# Patient Record
Sex: Female | Born: 1945 | Race: White | Hispanic: No | Marital: Married | Smoking: Never smoker
Health system: Southern US, Community
[De-identification: ages and names within clinical notes are randomized; demographics above are authoritative.]

## PROBLEM LIST (undated history)

## (undated) HISTORY — PX: ABDOMINAL HYSTERECTOMY: SHX81

## (undated) HISTORY — PX: ANKLE SURGERY: SHX546

## (undated) HISTORY — PX: EYE SURGERY: SHX253

---

## 2013-05-14 ENCOUNTER — Encounter (HOSPITAL_COMMUNITY): Payer: Self-pay | Admitting: Emergency Medicine

## 2013-05-14 ENCOUNTER — Emergency Department (HOSPITAL_COMMUNITY)
Admission: EM | Admit: 2013-05-14 | Discharge: 2013-05-14 | Disposition: A | Discharge status: Home / Self Care | Payer: Medicare PPO | Attending: Emergency Medicine | Admitting: Emergency Medicine

## 2013-05-14 DIAGNOSIS — G510 Bell's palsy: Secondary | ICD-10-CM | POA: Insufficient documentation

## 2013-05-14 MED ORDER — VALACYCLOVIR HCL 500 MG PO TABS: 500.0000 mg | ORAL_TABLET | Freq: Two times a day (BID) | ORAL | Status: DC

## 2013-05-14 MED ORDER — PREDNISONE 20 MG PO TABS: ORAL_TABLET | ORAL | Status: DC

## 2013-05-14 NOTE — ED Notes (Signed)
Pt reports last night she took 3 glucosamine pills for her arthritis and noticed a bad taste in her mouth all night afterwards, she thought it was due to the pills she took. Then this am she was brushing her teeth and noticed that there was some numbness inside of her mouth. States "feels like i went to the dentist and got some novocain." she has a L sided facial droop now. She has no other stroke symptoms, grips = bilaterally, MAE, speech is clear, A&Ox4.

## 2013-05-14 NOTE — ED Provider Notes (Signed)
CSN: [id number]     Arrival date & time 05/14/13  0920 History   First MD Initiated Contact with Patient 05/14/13 [phone]     Chief Complaint  Patient presents with  . Facial Droop     (Consider location/radiation/quality/duration/timing/severity/associated sxs/prior Treatment) HPI Comments: Patient presents to the ER for evaluation of left facial droop. Patient reports that she had some slightly strange sensation on the left side of her face yesterday, but did not think much of it. When she woke up this morning, however, she noticed that the left side of her face is numb and does not move with the other side. She has not had any headache. There is no numbness, tingling, weakness in any of her extremities. She did have cold symptoms for a couple of weeks prior to this, but these have resolved. There is no eye pain or vision change. She has had tearing of the left eye.   History reviewed. No pertinent past medical history. Past Surgical History  Procedure Laterality Date  . Abdominal hysterectomy    . Ankle surgery    . Eye surgery     No family history on file. History  Substance Use Topics  . Smoking status: Never Smoker   . Smokeless tobacco: Not on file  . Alcohol Use: No   OB History   Grav Para Term Preterm Abortions TAB SAB Ect Mult Living                 Review of Systems  Neurological: Positive for numbness (left face).  All other systems reviewed and are negative.      Allergies  Review of patient's allergies indicates no known allergies.  Home Medications   Current Outpatient Rx  Name  Route  Sig  Dispense  Refill  . predniSONE (DELTASONE) 20 MG tablet      3 tabs po daily x 5 days, then 2.5 tabs x 1 days, then 2 tabs x 1 days, then 1.5 tab x 1 days, then 1 tab x 1 day, then 0.5 tab x 1 day, then stop.   23 tablet   0   . valACYclovir (VALTREX) 500 MG tablet   Oral   Take 1 tablet (500 mg total) by mouth 2 (two) times daily.   14 tablet   0    BP  165/71  Pulse 75  Temp(Src) 97.8 F (36.6 C) (Oral)  Resp 18  Ht 5\' 7"  (1.702 m)  Wt 176 lb (79.833 kg)  BMI 27.56 kg/m2  SpO2 99% Physical Exam  Constitutional: She is oriented to person, place, and time. She appears well-developed and well-nourished. No distress.  HENT:  Head: Normocephalic and atraumatic.  Right Ear: Hearing normal.  Left Ear: Hearing normal.  Nose: Nose normal.  Mouth/Throat: Oropharynx is clear and moist and mucous membranes are normal.  Eyes: Conjunctivae and EOM are normal. Pupils are equal, round, and reactive to light.  Neck: Normal range of motion. Neck supple.  Cardiovascular: Regular rhythm, S1 normal and S2 normal.  Exam reveals no gallop and no friction rub.   No murmur heard. Pulmonary/Chest: Effort normal and breath sounds normal. No respiratory distress. She exhibits no tenderness.  Abdominal: Soft. Normal appearance and bowel sounds are normal. There is no hepatosplenomegaly. There is no tenderness. There is no rebound, no guarding, no tenderness at McBurney's point and negative Murphy's sign. No hernia.  Musculoskeletal: Normal range of motion.  Neurological: She is alert and oriented to person, place, and time.  She has normal strength. A cranial nerve deficit (left facial - including forehead and eyelid) is present. No sensory deficit. Coordination normal. GCS eye subscore is 4. GCS verbal subscore is 5. GCS motor subscore is 6.  Skin: Skin is warm, dry and intact. No rash noted. No cyanosis.  Psychiatric: She has a normal mood and affect. Her speech is normal and behavior is normal. Thought content normal.    ED Course  Procedures (including critical care time) Labs Review Labs Reviewed - No data to display Imaging Review No results found.  EKG Interpretation   None       MDM   Final diagnoses:  Bell's palsy    Patient this morning with left facial droop. Patient has obvious facial nerve palsy on examination. This is not consistent  with central nervous system involvement, but rather peripheral nerve. Diagnosis of Bell's palsy is made by examination. She has no other neurologic findings. Patient will be treated empirically with prednisone, Valtrex. She was informed that she could have a prolonged recovery period may not recover from this. She will be referred to neurology.    Gilda Crease, MD 05/14/13 [phone]

## 2013-05-14 NOTE — Discharge Instructions (Signed)
Bell's Palsy  Bell's palsy is a condition in which the muscles on one side of the face cannot move (paralysis). This is because the nerves in the face are paralyzed. It is most often thought to be caused by a virus. The virus causes swelling of the nerve that controls movement on one side of the face. The nerve travels through a tight space surrounded by bone. When the nerve swells, it can be compressed by the bone. This results in damage to the protective covering around the nerve. This damage interferes with how the nerve communicates with the muscles of the face. As a result, it can cause weakness or paralysis of the facial muscles.   Injury (trauma), tumor, and surgery may cause Bell's palsy, but most of the time the cause is unknown. It is a relatively common condition. It starts suddenly (abrupt onset) with the paralysis usually ending within 2 days. Bell's palsy is not dangerous. But because the eye does not close properly, you may need care to keep the eye from getting dry. This can include splinting (to keep the eye shut) or moistening with artificial tears. Bell's palsy very seldom occurs on both sides of the face at the same time.  SYMPTOMS    Eyebrow sagging.   Drooping of the eyelid and corner of the mouth.   Inability to close one eye.   Loss of taste on the front of the tongue.   Sensitivity to loud noises.  TREATMENT   The treatment is usually non-surgical. If the patient is seen within the first 24 to 48 hours, a short course of steroids may be prescribed, in an attempt to shorten the length of the condition. Antiviral medicines may also be used with the steroids, but it is unclear if they are helpful.   You will need to protect your eye, if you cannot close it. The cornea (clear covering over your eye) will become dry and can be damaged. Artificial tears can be used to keep your eye moist. Glasses or an eye patch should be worn to protect your eye.  PROGNOSIS   Recovery is variable, ranging  from days to months. Although the problem usually goes away completely (about 80% of cases resolve), predicting the outcome is impossible. Most people improve within 3 weeks of when the symptoms began. Improvement may continue for 3 to 6 months. A small number of people have moderate to severe weakness that is permanent.   HOME CARE INSTRUCTIONS    If your caregiver prescribed medication to reduce swelling in the nerve, use as directed. Do not stop taking the medication unless directed by your caregiver.   Use moisturizing eye drops as needed to prevent drying of your eye, as directed by your caregiver.   Protect your eye, as directed by your caregiver.   Use facial massage and exercises, as directed by your caregiver.   Perform your normal activities, and get your normal rest.  SEEK IMMEDIATE MEDICAL CARE IF:    There is pain, redness or irritation in the eye.   You or your child has an oral temperature above 102 F (38.9 C), not controlled by medicine.  MAKE SURE YOU:    Understand these instructions.   Will watch your condition.   Will get help right away if you are not doing well or get worse.  Document Released: 03/11/2005 Document Revised: 06/03/2011 Document Reviewed: 03/20/2009  Exit[employer] Patient Information 2014 [employer].

## 2013-08-17 ENCOUNTER — Ambulatory Visit: Payer: Self-pay | Admitting: Podiatrist

## 2014-07-25 DIAGNOSIS — E559 Vitamin D deficiency, unspecified: Secondary | ICD-10-CM | POA: Diagnosis not present

## 2014-07-25 DIAGNOSIS — Z1389 Encounter for screening for other disorder: Secondary | ICD-10-CM | POA: Diagnosis not present

## 2014-07-25 DIAGNOSIS — Z01419 Encounter for gynecological examination (general) (routine) without abnormal findings: Secondary | ICD-10-CM | POA: Diagnosis not present

## 2014-07-25 DIAGNOSIS — Z0000 Encounter for general adult medical examination without abnormal findings: Secondary | ICD-10-CM | POA: Diagnosis not present

## 2014-07-25 DIAGNOSIS — D510 Vitamin B12 deficiency anemia due to intrinsic factor deficiency: Secondary | ICD-10-CM | POA: Diagnosis not present

## 2014-07-25 DIAGNOSIS — D509 Iron deficiency anemia, unspecified: Secondary | ICD-10-CM | POA: Diagnosis not present

## 2014-08-01 DIAGNOSIS — D510 Vitamin B12 deficiency anemia due to intrinsic factor deficiency: Secondary | ICD-10-CM | POA: Diagnosis not present

## 2014-08-08 DIAGNOSIS — D510 Vitamin B12 deficiency anemia due to intrinsic factor deficiency: Secondary | ICD-10-CM | POA: Diagnosis not present

## 2014-08-15 DIAGNOSIS — D510 Vitamin B12 deficiency anemia due to intrinsic factor deficiency: Secondary | ICD-10-CM | POA: Diagnosis not present

## 2014-08-26 DIAGNOSIS — D511 Vitamin B12 deficiency anemia due to selective vitamin B12 malabsorption with proteinuria: Secondary | ICD-10-CM | POA: Diagnosis not present

## 2014-09-09 DIAGNOSIS — Z1211 Encounter for screening for malignant neoplasm of colon: Secondary | ICD-10-CM | POA: Diagnosis not present

## 2014-10-03 DIAGNOSIS — D510 Vitamin B12 deficiency anemia due to intrinsic factor deficiency: Secondary | ICD-10-CM | POA: Diagnosis not present

## 2014-10-05 DIAGNOSIS — M810 Age-related osteoporosis without current pathological fracture: Secondary | ICD-10-CM | POA: Diagnosis not present

## 2014-10-05 DIAGNOSIS — K5730 Diverticulosis of large intestine without perforation or abscess without bleeding: Secondary | ICD-10-CM | POA: Diagnosis not present

## 2014-10-05 DIAGNOSIS — Z1211 Encounter for screening for malignant neoplasm of colon: Secondary | ICD-10-CM | POA: Diagnosis not present

## 2014-10-05 DIAGNOSIS — K648 Other hemorrhoids: Secondary | ICD-10-CM | POA: Diagnosis not present

## 2014-10-05 DIAGNOSIS — G510 Bell's palsy: Secondary | ICD-10-CM | POA: Diagnosis not present

## 2014-11-08 DIAGNOSIS — D510 Vitamin B12 deficiency anemia due to intrinsic factor deficiency: Secondary | ICD-10-CM | POA: Diagnosis not present

## 2014-11-08 DIAGNOSIS — E559 Vitamin D deficiency, unspecified: Secondary | ICD-10-CM | POA: Diagnosis not present

## 2014-12-26 DIAGNOSIS — K644 Residual hemorrhoidal skin tags: Secondary | ICD-10-CM | POA: Diagnosis not present

## 2015-02-07 DIAGNOSIS — H2703 Aphakia, bilateral: Secondary | ICD-10-CM | POA: Diagnosis not present

## 2015-02-07 DIAGNOSIS — H16223 Keratoconjunctivitis sicca, not specified as Sjogren's, bilateral: Secondary | ICD-10-CM | POA: Diagnosis not present

## 2015-10-30 DIAGNOSIS — Z524 Kidney donor: Secondary | ICD-10-CM | POA: Insufficient documentation

## 2015-10-30 DIAGNOSIS — Z005 Encounter for examination of potential donor of organ and tissue: Secondary | ICD-10-CM | POA: Insufficient documentation

## 2016-03-25 DIAGNOSIS — Z905 Acquired absence of kidney: Secondary | ICD-10-CM

## 2016-03-25 HISTORY — DX: Acquired absence of kidney: Z90.5

## 2017-12-31 DIAGNOSIS — K56609 Unspecified intestinal obstruction, unspecified as to partial versus complete obstruction: Secondary | ICD-10-CM | POA: Insufficient documentation

## 2018-01-01 DIAGNOSIS — Z905 Acquired absence of kidney: Secondary | ICD-10-CM | POA: Insufficient documentation

## 2019-06-22 DIAGNOSIS — Z90710 Acquired absence of both cervix and uterus: Secondary | ICD-10-CM | POA: Diagnosis not present

## 2019-06-22 DIAGNOSIS — K56609 Unspecified intestinal obstruction, unspecified as to partial versus complete obstruction: Secondary | ICD-10-CM | POA: Diagnosis not present

## 2019-06-22 DIAGNOSIS — R933 Abnormal findings on diagnostic imaging of other parts of digestive tract: Secondary | ICD-10-CM | POA: Diagnosis not present

## 2019-06-22 DIAGNOSIS — Z905 Acquired absence of kidney: Secondary | ICD-10-CM | POA: Diagnosis not present

## 2019-06-22 DIAGNOSIS — R112 Nausea with vomiting, unspecified: Secondary | ICD-10-CM | POA: Diagnosis not present

## 2019-06-22 DIAGNOSIS — R1031 Right lower quadrant pain: Secondary | ICD-10-CM | POA: Diagnosis not present

## 2019-06-22 DIAGNOSIS — R1084 Generalized abdominal pain: Secondary | ICD-10-CM | POA: Diagnosis not present

## 2019-06-22 DIAGNOSIS — Z8249 Family history of ischemic heart disease and other diseases of the circulatory system: Secondary | ICD-10-CM | POA: Diagnosis not present

## 2019-06-22 DIAGNOSIS — M810 Age-related osteoporosis without current pathological fracture: Secondary | ICD-10-CM | POA: Diagnosis not present

## 2019-06-22 DIAGNOSIS — Z8049 Family history of malignant neoplasm of other genital organs: Secondary | ICD-10-CM | POA: Diagnosis not present

## 2019-06-22 DIAGNOSIS — D649 Anemia, unspecified: Secondary | ICD-10-CM | POA: Diagnosis not present

## 2019-07-12 DIAGNOSIS — Z8719 Personal history of other diseases of the digestive system: Secondary | ICD-10-CM | POA: Diagnosis not present

## 2019-07-12 DIAGNOSIS — Z6823 Body mass index (BMI) 23.0-23.9, adult: Secondary | ICD-10-CM | POA: Diagnosis not present

## 2019-07-12 DIAGNOSIS — F419 Anxiety disorder, unspecified: Secondary | ICD-10-CM | POA: Diagnosis not present

## 2019-07-12 DIAGNOSIS — K209 Esophagitis, unspecified without bleeding: Secondary | ICD-10-CM | POA: Diagnosis not present

## 2019-07-12 DIAGNOSIS — Z524 Kidney donor: Secondary | ICD-10-CM | POA: Diagnosis not present

## 2019-08-05 DIAGNOSIS — R110 Nausea: Secondary | ICD-10-CM | POA: Diagnosis not present

## 2019-08-05 DIAGNOSIS — R1084 Generalized abdominal pain: Secondary | ICD-10-CM | POA: Diagnosis not present

## 2019-08-05 DIAGNOSIS — R140 Abdominal distension (gaseous): Secondary | ICD-10-CM | POA: Diagnosis not present

## 2019-08-05 DIAGNOSIS — R933 Abnormal findings on diagnostic imaging of other parts of digestive tract: Secondary | ICD-10-CM | POA: Diagnosis not present

## 2019-08-05 DIAGNOSIS — K56600 Partial intestinal obstruction, unspecified as to cause: Secondary | ICD-10-CM | POA: Diagnosis not present

## 2019-09-03 DIAGNOSIS — K2930 Chronic superficial gastritis without bleeding: Secondary | ICD-10-CM | POA: Diagnosis not present

## 2019-09-03 DIAGNOSIS — K3189 Other diseases of stomach and duodenum: Secondary | ICD-10-CM | POA: Diagnosis not present

## 2019-09-03 DIAGNOSIS — R110 Nausea: Secondary | ICD-10-CM | POA: Diagnosis not present

## 2019-09-03 DIAGNOSIS — R112 Nausea with vomiting, unspecified: Secondary | ICD-10-CM | POA: Diagnosis not present

## 2019-10-05 DIAGNOSIS — Z1331 Encounter for screening for depression: Secondary | ICD-10-CM | POA: Diagnosis not present

## 2019-10-05 DIAGNOSIS — Z0000 Encounter for general adult medical examination without abnormal findings: Secondary | ICD-10-CM | POA: Diagnosis not present

## 2019-10-05 DIAGNOSIS — Z6822 Body mass index (BMI) 22.0-22.9, adult: Secondary | ICD-10-CM | POA: Diagnosis not present

## 2019-10-05 DIAGNOSIS — Z79899 Other long term (current) drug therapy: Secondary | ICD-10-CM | POA: Diagnosis not present

## 2019-10-09 DIAGNOSIS — J019 Acute sinusitis, unspecified: Secondary | ICD-10-CM | POA: Diagnosis not present

## 2019-10-18 DIAGNOSIS — F419 Anxiety disorder, unspecified: Secondary | ICD-10-CM | POA: Diagnosis not present

## 2019-10-18 DIAGNOSIS — J329 Chronic sinusitis, unspecified: Secondary | ICD-10-CM | POA: Diagnosis not present

## 2019-10-18 DIAGNOSIS — J40 Bronchitis, not specified as acute or chronic: Secondary | ICD-10-CM | POA: Diagnosis not present

## 2019-10-22 DIAGNOSIS — J40 Bronchitis, not specified as acute or chronic: Secondary | ICD-10-CM | POA: Diagnosis not present

## 2019-10-22 DIAGNOSIS — J329 Chronic sinusitis, unspecified: Secondary | ICD-10-CM | POA: Diagnosis not present

## 2020-10-30 DIAGNOSIS — Z1331 Encounter for screening for depression: Secondary | ICD-10-CM | POA: Diagnosis not present

## 2020-10-30 DIAGNOSIS — F419 Anxiety disorder, unspecified: Secondary | ICD-10-CM | POA: Diagnosis not present

## 2020-10-30 DIAGNOSIS — Z6823 Body mass index (BMI) 23.0-23.9, adult: Secondary | ICD-10-CM | POA: Diagnosis not present

## 2020-10-30 DIAGNOSIS — M1990 Unspecified osteoarthritis, unspecified site: Secondary | ICD-10-CM | POA: Diagnosis not present

## 2020-10-30 DIAGNOSIS — Z9119 Patient's noncompliance with other medical treatment and regimen: Secondary | ICD-10-CM | POA: Diagnosis not present

## 2020-10-30 DIAGNOSIS — Z0000 Encounter for general adult medical examination without abnormal findings: Secondary | ICD-10-CM | POA: Diagnosis not present

## 2021-01-04 DIAGNOSIS — I10 Essential (primary) hypertension: Secondary | ICD-10-CM | POA: Diagnosis not present

## 2021-01-04 DIAGNOSIS — Z01419 Encounter for gynecological examination (general) (routine) without abnormal findings: Secondary | ICD-10-CM | POA: Diagnosis not present

## 2021-01-04 DIAGNOSIS — Z6824 Body mass index (BMI) 24.0-24.9, adult: Secondary | ICD-10-CM | POA: Diagnosis not present

## 2021-03-05 DIAGNOSIS — B9689 Other specified bacterial agents as the cause of diseases classified elsewhere: Secondary | ICD-10-CM | POA: Diagnosis not present

## 2021-03-05 DIAGNOSIS — J019 Acute sinusitis, unspecified: Secondary | ICD-10-CM | POA: Diagnosis not present

## 2021-04-11 DIAGNOSIS — H2703 Aphakia, bilateral: Secondary | ICD-10-CM | POA: Diagnosis not present

## 2021-06-06 DIAGNOSIS — R109 Unspecified abdominal pain: Secondary | ICD-10-CM | POA: Diagnosis not present

## 2021-06-06 DIAGNOSIS — Z8719 Personal history of other diseases of the digestive system: Secondary | ICD-10-CM | POA: Diagnosis not present

## 2021-06-06 DIAGNOSIS — R4182 Altered mental status, unspecified: Secondary | ICD-10-CM | POA: Diagnosis not present

## 2021-06-06 DIAGNOSIS — Z6823 Body mass index (BMI) 23.0-23.9, adult: Secondary | ICD-10-CM | POA: Diagnosis not present

## 2021-06-06 DIAGNOSIS — Z13220 Encounter for screening for lipoid disorders: Secondary | ICD-10-CM | POA: Diagnosis not present

## 2021-07-02 ENCOUNTER — Encounter: Payer: Self-pay | Admitting: Neurology

## 2021-07-02 ENCOUNTER — Ambulatory Visit: Payer: Medicare PPO | Admitting: Neurology

## 2021-07-02 VITALS — BP 132/72 | HR 69 | Ht 66.5 in | Wt 155.5 lb

## 2021-07-02 DIAGNOSIS — G459 Transient cerebral ischemic attack, unspecified: Secondary | ICD-10-CM | POA: Diagnosis not present

## 2021-07-02 NOTE — Patient Instructions (Addendum)
Obtain stroke labs with BMP  ?Will obtain CT head and neck with contrast (if normal kidney function)  ?Continue with aspirin daily  ?Follow up with your primary care physician ?Follow up in 1 year or sooner if worse  ?

## 2021-07-02 NOTE — Progress Notes (Signed)
? ?[facility] ? ?PATIENT: Janet ClineJudy O Guereca ?DOB: January 04, 1946 ? ?REQUESTING CLINICIAN: Noni Saupeedding, John F. II, MD ?HISTORY FROM: Patient and daughter Amy  ?REASON FOR VISIT: Vision loss and work finding difficulty  ? ? ?HISTORICAL ? ?CHIEF COMPLAINT:  ?Chief Complaint  ?Patient presents with  ? New Patient (Initial Visit)  ?  Rm 15. Accompanied by daughter, Amy. ?NP/Paper Proficient/[facility]/John Redding MD/cognitive changes, possible TIA.  ? ? ?HISTORY OF PRESENT ILLNESS:  ?This is a 76 year old woman with past medical history of Bell's palsy who is presenting with 5-minute history of loss of vision on the left visual field and word finding difficulty.  Patient report being at home, sitting with crying, had sudden loss of her left visual field lasted about 5 minutes. During this time she could not speak, she could not say the name of the family members and this lasted about 25 minutes.  After which her vision improved and her speech also improved. She was not taken to the ED. This happened on March 14,  She denies any previous similar history and since then has not had any reoccurrence. She saw her PCP the following day and was started on daily aspirin.  ?Denies any previous history of stroke or strokelike symptoms. 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 ? ?OTHER MEDICAL CONDITIONS: History of Bells's Palsy  ? ? ?REVIEW OF SYSTEMS: Full 14 system review of systems performed and negative with exception of: as noted in the HPI  ? ?ALLERGIES: ?Allergies  ?Allergen Reactions  ? Oxycodone-Acetaminophen Nausea And Vomiting  ? ? ?HOME MEDICATIONS: ?Outpatient Medications Prior to Visit  ?Medication Sig Dispense Refill  ? ASPIRIN 81 PO Take 1 tablet by mouth daily.    ? escitalopram (LEXAPRO) 5 MG tablet Take 1 tablet by mouth daily.    ? polycarbophil (FIBERCON) 625 MG tablet Take 625 mg by mouth daily.     ? predniSONE (DELTASONE) 20 MG tablet 3 tabs po daily x 5 days, then 2.5 tabs x 1 days, then 2 tabs x 1 days, then 1.5 tab x 1 days, then 1 tab x 1 day, then 0.5 tab x 1 day, then stop. 23 tablet 0  ? valACYclovir (VALTREX) 500 MG tablet Take 1 tablet (500 mg total) by mouth 2 (two) times daily. 14 tablet 0  ? ?No facility-administered medications prior to visit.  ? ? ?PAST MEDICAL HISTORY: ?Past Medical History:  ?Diagnosis Date  ? H/O left nephrectomy 2018  ? Kidney donation  ? ? ?PAST SURGICAL HISTORY: ?Past Surgical History:  ?Procedure Laterality Date  ? ABDOMINAL HYSTERECTOMY    ? ANKLE SURGERY    ? EYE SURGERY    ? ? ?FAMILY HISTORY: ?History reviewed. No pertinent family history. ? ?SOCIAL HISTORY: ?Social History  ? ?Socioeconomic History  ? Marital status: Married  ?  Spouse name: Not on file  ? Number of children: Not on file  ? Years of education: Not on file  ? Highest education level: Not on file  ?Occupational History  ? Not on file  ?Tobacco Use  ? Smoking status: Never  ? Smokeless tobacco: Not on file  ?Substance and Sexual Activity  ? Alcohol use: No  ? Drug use: Not on file  ? Sexual activity: Not on file  ?Other Topics Concern  ? Not on file  ?Social History Narrative  ? Not on file  ? ?  Social Determinants of Health  ? ?Financial Resource Strain: Not on file  ?Food Insecurity: Not on file  ?Transportation Needs: Not on file  ?Physical Activity: Not on file  ?Stress: Not on file  ?Social Connections: Not on file  ?Intimate Partner Violence: Not on file  ? ? ?PHYSICAL EXAM ? ?GENERAL EXAM/CONSTITUTIONAL: ?Vitals:  ?Vitals:  ? 07/02/21 1045  ?BP: 132/72  ?Pulse: 69  ?Weight: 155 lb 8 oz (70.5 kg)  ?Height: 5' 6.5" (1.689 m)  ? ?Body mass index is 24.72 kg/m?. ?Wt Readings from Last 3 Encounters:  ?07/02/21 155 lb 8 oz (70.5 kg)  ?05/14/13 176 lb (79.8 kg)  ? ?Patient is in no distress; well developed, nourished and groomed; neck is supple ? ?EYES: ?Pupils round and reactive to light, Visual fields  full to confrontation, Extraocular movements intacts,  ? ?MUSCULOSKELETAL: ?Gait, strength, tone, movements noted in Neurologic exam below ? ?NEUROLOGIC: ?MENTAL STATUS:  ?   ? View : No data to display.  ?  ?  ?  ? ?awake, alert, oriented to person, place and time ?recent and remote memory intact ?normal attention and concentration ?language fluent, comprehension intact, naming intact ?fund of knowledge appropriate ? ?  07/02/2021  ? 10:50 AM  ?Montreal Cognitive Assessment   ?Visuospatial/ Executive (0/5) 5  ?Naming (0/3) 2  ?Attention: Read list of digits (0/2) 1  ?Attention: Read list of letters (0/1) 1  ?Attention: Serial 7 subtraction starting at 100 (0/3) 3  ?Language: Repeat phrase (0/2) 2  ?Language : Fluency (0/1) 0  ?Abstraction (0/2) 2  ?Delayed Recall (0/5) 4  ?Orientation (0/6) 6  ?Total 26  ?Adjusted Score (based on education) 26  ? ? ? ? ?CRANIAL NERVE:  ?2nd, 3rd, 4th, 6th - pupils equal and reactive to light, visual fields full to confrontation, extraocular muscles intact, no nystagmus ?5th - facial sensation symmetric ?7th - mild left facial weakness ?8th - hearing decrease ?9th - palate elevates symmetrically, uvula midline ?11th - shoulder shrug symmetric ?12th - tongue protrusion midline ? ?MOTOR:  ?normal bulk and tone, full strength in the BUE, BLE ? ?SENSORY:  ?normal and symmetric to light touch, pinprick, temperature, vibration ? ?COORDINATION:  ?finger-nose-finger, fine finger movements normal ? ?REFLEXES:  ?deep tendon reflexes present and symmetric ? ?GAIT/STATION:  ?normal ? ? ?DIAGNOSTIC DATA (LABS, IMAGING, TESTING) ?- I reviewed patient records, labs, notes, testing and imaging myself where available. ? ?No results found for: WBC, HGB, HCT, MCV, PLT ?No results found for: NA, K, CL, CO2, GLUCOSE, BUN, CREATININE, CALCIUM, PROT, ALBUMIN, AST, ALT, ALKPHOS, BILITOT, GFRNONAA, GFRAA ?No results found for: CHOL, HDL, LDLCALC, LDLDIRECT, TRIG, CHOLHDL ?No results found for: HGBA1C ?No  results found for: VITAMINB12 ?No results found for: TSH ? ? ? ?ASSESSMENT AND PLAN ? ?76 y.o. year old female with history of Bell's palsy who is presenting after 1 episode of sudden loss of left vision field associated with word finding difficulty, cannot state the name of grandkids lasting for 25-minutes.  Patient episode is concerning for a TIA.  I will obtain a CT head preferably with contrast if normal kidney function.  I will also obtain stroke lab including hemoglobin A1c and lipids.  If her BMP come back within normal limits, we will obtain a CT head with and without contrast and CTA head and neck with contrast. ?I will contact the patient to go over the result.  Advised her to continue with aspirin 81 mg daily and I will see her  in 1 year for follow-up ? ? ? ?1. TIA (transient ischemic attack)   ? ? ? ?Patient Instructions  ?Obtain stroke labs with BMP  ?Will obtain CT head and neck with contrast (if normal kidney function)  ?Continue with aspirin daily  ?Follow up with your primary care physician ?Follow up in 1 year or sooner if worse  ? ?Orders Placed This Encounter  ?Procedures  ? Basic Metabolic Panel  ? Hemoglobin A1c  ? Lipid panel  ? ? ?No orders of the defined types were placed in this encounter. ? ? ?Return in about 1 year (around 07/03/2022). ? ?I have spent a total of 47 minutes dedicated to this patient today, preparing to see patient, performing a medically appropriate examination and evaluation, ordering tests and/or medications and procedures, and counseling and educating the patient/family/caregiver; independently interpreting result and communicating results to the family/patient/caregiver; and documenting clinical information in the electronic medical record. ? ? ?Windell Norfolk, MD 07/02/2021, 11:27 AM ? ?[facility] ?[address], [address] ?[place], [place] [address] ?[phone] ? ?

## 2021-07-03 LAB — HEMOGLOBIN A1C
Est. average glucose Bld gHb Est-mCnc: 108 mg/dL
Hgb A1c MFr Bld: 5.4 % (ref 4.8–5.6)

## 2021-07-03 LAB — BASIC METABOLIC PANEL
BUN/Creatinine Ratio: 12 (ref 12–28)
BUN: 15 mg/dL (ref 8–27)
CO2: 26 mmol/L (ref 20–29)
Calcium: 9.3 mg/dL (ref 8.7–10.3)
Chloride: 110 mmol/L — ABNORMAL HIGH (ref 96–106)
Creatinine, Ser: 1.28 mg/dL — ABNORMAL HIGH (ref 0.57–1.00)
Glucose: 94 mg/dL (ref 70–99)
Potassium: 5.3 mmol/L — ABNORMAL HIGH (ref 3.5–5.2)
Sodium: 147 mmol/L — ABNORMAL HIGH (ref 134–144)
eGFR: 44 mL/min/{1.73_m2} — ABNORMAL LOW (ref >59)

## 2021-07-03 LAB — LIPID PANEL
Chol/HDL Ratio: 3.8 ratio (ref 0.0–4.4)
Cholesterol, Total: 215 mg/dL — ABNORMAL HIGH (ref 100–199)
HDL: 57 mg/dL (ref >39)
LDL Chol Calc (NIH): 135 mg/dL — ABNORMAL HIGH (ref 0–99)
Triglycerides: 127 mg/dL (ref 0–149)
VLDL Cholesterol Cal: 23 mg/dL (ref 5–40)

## 2021-07-03 MED ORDER — ROSUVASTATIN CALCIUM 20 MG PO TABS: 20.0000 mg | ORAL_TABLET | Freq: Every evening | ORAL | 3 refills | Status: AC

## 2021-07-03 NOTE — Progress Notes (Signed)
Discussed with daughter Amy labs results. Will order CT head, CTA head and neck and Crestor 20. All questions answered.  ? ?Dr. Teresa Coombs

## 2021-07-03 NOTE — Addendum Note (Signed)
Addended byAlric Ran on: 07/03/2021 01:49 PM ? ? Modules accepted: Orders ? ?

## 2021-07-04 ENCOUNTER — Telehealth: Payer: Self-pay | Admitting: Neurology

## 2021-07-04 DIAGNOSIS — G459 Transient cerebral ischemic attack, unspecified: Secondary | ICD-10-CM

## 2021-07-04 NOTE — Telephone Encounter (Signed)
The CT Head wo contrast order is correct, but the CT Angio Head and CT Angio Neck orders need to be fixed. It doesn't need to say stroke.  ?

## 2021-07-04 NOTE — Telephone Encounter (Signed)
Noted, thank you! ? ?Janet Mejia: [phone] (exp. 07/04/21 to 08/03/21) order sent to GI, they will reach out to the patient to schedule.  ?

## 2021-07-04 NOTE — Telephone Encounter (Signed)
Changed orders for CTA neck and head on behalf of Dr. April Manson. ?

## 2021-07-17 NOTE — Telephone Encounter (Signed)
Patient daughter Amy called asking about the CT that they said they got a notification that it was without dye.. ? ?I tried calling her back but her voicemail is full- the orders or with or without contrast.  ?

## 2021-07-20 ENCOUNTER — Other Ambulatory Visit: Payer: Medicare PPO

## 2021-07-20 ENCOUNTER — Ambulatory Visit
Admission: RE | Admit: 2021-07-20 | Discharge: 2021-07-20 | Disposition: A | Discharge status: Home / Self Care | Payer: Medicare PPO | Source: Ambulatory Visit | Attending: Neurology | Admitting: Neurology

## 2021-07-20 DIAGNOSIS — G459 Transient cerebral ischemic attack, unspecified: Secondary | ICD-10-CM

## 2021-07-20 DIAGNOSIS — R29818 Other symptoms and signs involving the nervous system: Secondary | ICD-10-CM | POA: Diagnosis not present

## 2021-07-20 MED ORDER — IOPAMIDOL (ISOVUE-370) INJECTION 76%
60.0000 mL | Freq: Once | INTRAVENOUS | Status: AC | PRN
  Administered 2021-07-20: 60 mL via INTRAVENOUS

## 2021-07-23 ENCOUNTER — Telehealth: Payer: Self-pay

## 2021-07-23 NOTE — Telephone Encounter (Signed)
I called patient. I spoke with Janet Mejia, patient's DiL, per DPR. I discussed patient's CTA head and neck results. Patient's DiL verbalized understanding of results and had no questions at this time. ?

## 2021-07-23 NOTE — Telephone Encounter (Signed)
-----   Message from Huston Foley, MD sent at 07/23/2021  9:43 AM EDT ----- ?Please call patient that her CT angiogram of the head and neck showed no obvious blockages in the main arteries of the head and neck. ?

## 2021-09-02 DIAGNOSIS — S9032XA Contusion of left foot, initial encounter: Secondary | ICD-10-CM | POA: Diagnosis not present

## 2021-11-25 DIAGNOSIS — S63502A Unspecified sprain of left wrist, initial encounter: Secondary | ICD-10-CM | POA: Diagnosis not present

## 2022-03-14 DIAGNOSIS — J029 Acute pharyngitis, unspecified: Secondary | ICD-10-CM | POA: Diagnosis not present

## 2022-03-14 DIAGNOSIS — R059 Cough, unspecified: Secondary | ICD-10-CM | POA: Diagnosis not present

## 2022-03-14 DIAGNOSIS — J069 Acute upper respiratory infection, unspecified: Secondary | ICD-10-CM | POA: Diagnosis not present

## 2022-03-14 DIAGNOSIS — R0981 Nasal congestion: Secondary | ICD-10-CM | POA: Diagnosis not present

## 2022-04-08 ENCOUNTER — Telehealth: Payer: Self-pay | Admitting: Neurology

## 2022-04-08 ENCOUNTER — Encounter: Payer: Self-pay | Admitting: Neurology

## 2022-04-08 NOTE — Telephone Encounter (Signed)
LVM and sent letter in mail informing pt of need to reschedule 07/08/22 appointment - MD out

## 2022-05-09 DIAGNOSIS — Z636 Dependent relative needing care at home: Secondary | ICD-10-CM | POA: Diagnosis not present

## 2022-05-09 DIAGNOSIS — F4321 Adjustment disorder with depressed mood: Secondary | ICD-10-CM | POA: Diagnosis not present

## 2022-06-17 DIAGNOSIS — H2703 Aphakia, bilateral: Secondary | ICD-10-CM | POA: Diagnosis not present

## 2022-07-08 ENCOUNTER — Ambulatory Visit: Payer: Medicare PPO | Admitting: Neurology

## 2022-08-08 DIAGNOSIS — Z636 Dependent relative needing care at home: Secondary | ICD-10-CM | POA: Diagnosis not present

## 2022-08-08 DIAGNOSIS — F4321 Adjustment disorder with depressed mood: Secondary | ICD-10-CM | POA: Diagnosis not present

## 2022-08-08 DIAGNOSIS — D649 Anemia, unspecified: Secondary | ICD-10-CM | POA: Diagnosis not present

## 2022-08-08 DIAGNOSIS — Z9181 History of falling: Secondary | ICD-10-CM | POA: Diagnosis not present

## 2022-08-08 DIAGNOSIS — E559 Vitamin D deficiency, unspecified: Secondary | ICD-10-CM | POA: Diagnosis not present

## 2022-08-08 DIAGNOSIS — R8290 Unspecified abnormal findings in urine: Secondary | ICD-10-CM | POA: Diagnosis not present

## 2022-08-08 DIAGNOSIS — Z905 Acquired absence of kidney: Secondary | ICD-10-CM | POA: Diagnosis not present

## 2022-08-08 DIAGNOSIS — Z139 Encounter for screening, unspecified: Secondary | ICD-10-CM | POA: Diagnosis not present

## 2022-12-13 DIAGNOSIS — L02415 Cutaneous abscess of right lower limb: Secondary | ICD-10-CM | POA: Diagnosis not present

## 2023-01-16 IMAGING — CT CT ANGIO HEAD
2 of 11 series · 4 of 33 positions shown · non-contrast
Comparison: None.

CLINICAL DATA: Neuro deficit, acute, stroke suspected

EXAM:
CT ANGIOGRAPHY OF THE HEAD AND NECK
TECHNIQUE: Contiguous axial images were obtained from the base of the skull
through the vertex without intravenous contrast. Multidetector CT
imaging of the head and neck was performed using the standard
protocol during bolus administration of intravenous contrast.
Multiplanar CT image reconstructions and MIPs were obtained to
evaluate the vascular anatomy. Carotid stenosis measurements (when
applicable) are obtained utilizing NASCET criteria, using the distal
internal carotid diameter as the denominator.

[Series 12: brain 3.00 hr40 s3 sag without ibhc · sagittal · non-contrast · 0.34mm/px · 1 of 56 slices shown]
[im 28/56  soft-tissue]
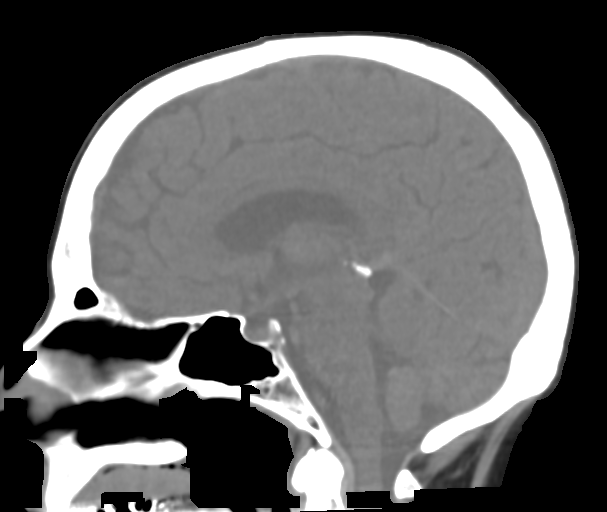

[Series 15: cta head & neck 1.00 hv48 s3 ax thin mips · axial · 0.48mm/px · z∈[-792,-430]mm · 3 of 363 slices shown]
[im 1/363  soft-tissue]
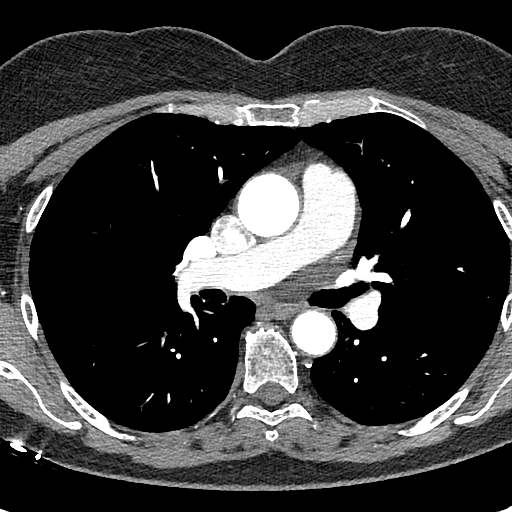
[im 182/363  bone]
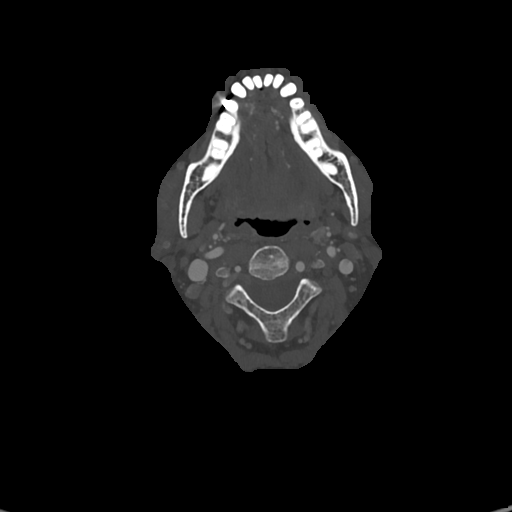
[im 363/363  soft-tissue]
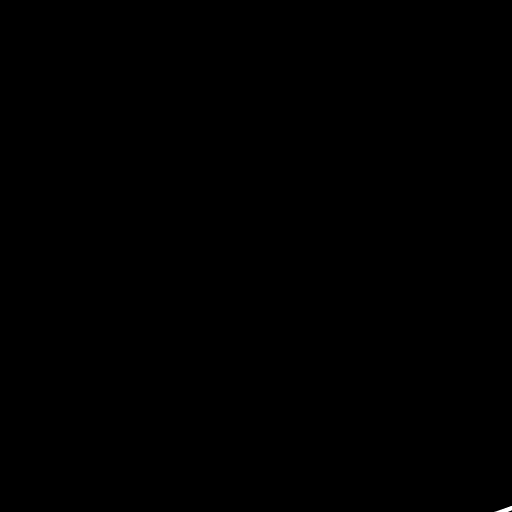

[4 of 33 positions shown; findings below may reference images not displayed]

RADIATION DOSE REDUCTION: This exam was performed according to the
departmental dose-optimization program which includes automated
exposure control, adjustment of the mA and/or kV according to
patient size and/or use of iterative reconstruction technique.

CONTRAST:  60mL K0ZESR-HW2 IOPAMIDOL (K0ZESR-HW2) INJECTION 76%
FINDINGS: CT HEAD

Brain: There is no acute intracranial hemorrhage, mass effect, or
edema. Gray-white differentiation is preserved. There is no
extra-axial fluid collection. Ventricles and sulci are within normal
limits in size and configuration. Patchy hypoattenuation in the
supratentorial white matter is nonspecific but may reflect mild
chronic microvascular ischemic changes.

Vascular: No hyperdense vessel.

Skull: Calvarium is unremarkable.

Sinuses/Orbits: Mild mucosal thickening. Bilateral lens
replacements.

Other: Mastoid air cells are clear.

Review of the MIP images confirms the above findings

CTA NECK

Aortic arch: Great vessel origins

Right carotid system: Patent.  No stenosis.

Left carotid system: Patent.  No stenosis.

Vertebral arteries: Patent. Left vertebral is dominant. No stenosis.

Skeleton: Cervical spine degenerative changes.

Other neck: Subcentimeter thyroid nodules. No ultrasound follow-up
is recommended.

Upper chest: Included lungs are clear.

Review of the MIP images confirms the above findings

CTA HEAD

Anterior circulation: Intracranial internal carotid arteries are
patent with mild calcified plaque. Anterior and middle cerebral
arteries are patent.

Posterior circulation: Intracranial vertebral arteries are patent.
Basilar artery is patent. Major cerebellar artery origins are
patent. Small bilateral posterior communicating arteries are
present. Posterior cerebral arteries are patent.

Venous sinuses: Patent as allowed by contrast bolus timing.

Review of the MIP images confirms the above findings
IMPRESSION: No acute intracranial abnormality. Mild chronic microvascular
ischemic changes.

No large vessel occlusion, hemodynamically significant stenosis, or
evidence of dissection.

## 2023-03-05 DIAGNOSIS — K219 Gastro-esophageal reflux disease without esophagitis: Secondary | ICD-10-CM | POA: Diagnosis not present

## 2023-03-05 DIAGNOSIS — N183 Chronic kidney disease, stage 3 unspecified: Secondary | ICD-10-CM | POA: Diagnosis not present

## 2023-03-05 DIAGNOSIS — E559 Vitamin D deficiency, unspecified: Secondary | ICD-10-CM | POA: Diagnosis not present

## 2023-03-05 DIAGNOSIS — D649 Anemia, unspecified: Secondary | ICD-10-CM | POA: Diagnosis not present

## 2023-04-07 DIAGNOSIS — J208 Acute bronchitis due to other specified organisms: Secondary | ICD-10-CM | POA: Diagnosis not present

## 2023-08-04 DIAGNOSIS — H903 Sensorineural hearing loss, bilateral: Secondary | ICD-10-CM | POA: Diagnosis not present

## 2023-09-08 DIAGNOSIS — D649 Anemia, unspecified: Secondary | ICD-10-CM | POA: Diagnosis not present

## 2023-09-08 DIAGNOSIS — Z139 Encounter for screening, unspecified: Secondary | ICD-10-CM | POA: Diagnosis not present

## 2023-09-08 DIAGNOSIS — E559 Vitamin D deficiency, unspecified: Secondary | ICD-10-CM | POA: Diagnosis not present

## 2023-09-08 DIAGNOSIS — J309 Allergic rhinitis, unspecified: Secondary | ICD-10-CM | POA: Diagnosis not present

## 2023-09-08 DIAGNOSIS — Z9181 History of falling: Secondary | ICD-10-CM | POA: Diagnosis not present

## 2023-09-08 DIAGNOSIS — F4321 Adjustment disorder with depressed mood: Secondary | ICD-10-CM | POA: Diagnosis not present

## 2023-09-08 DIAGNOSIS — N183 Chronic kidney disease, stage 3 unspecified: Secondary | ICD-10-CM | POA: Diagnosis not present

## 2023-10-06 DIAGNOSIS — H903 Sensorineural hearing loss, bilateral: Secondary | ICD-10-CM | POA: Diagnosis not present

## 2023-10-06 DIAGNOSIS — Z461 Encounter for fitting and adjustment of hearing aid: Secondary | ICD-10-CM | POA: Diagnosis not present

## 2023-12-23 DIAGNOSIS — Z9889 Other specified postprocedural states: Secondary | ICD-10-CM | POA: Diagnosis not present

## 2023-12-23 DIAGNOSIS — M25572 Pain in left ankle and joints of left foot: Secondary | ICD-10-CM | POA: Diagnosis not present

## 2024-01-02 DIAGNOSIS — M25572 Pain in left ankle and joints of left foot: Secondary | ICD-10-CM | POA: Diagnosis not present
